# Patient Record
Sex: Male | Born: 1980 | Race: White | Hispanic: No | Marital: Married | Smoking: Never smoker
Health system: Southern US, Community
[De-identification: ages and names within clinical notes are randomized; demographics above are authoritative.]

## PROBLEM LIST (undated history)

## (undated) DIAGNOSIS — T7840XA Allergy, unspecified, initial encounter: Secondary | ICD-10-CM

## (undated) DIAGNOSIS — I10 Essential (primary) hypertension: Secondary | ICD-10-CM

## (undated) HISTORY — DX: Essential (primary) hypertension: I10

## (undated) HISTORY — DX: Allergy, unspecified, initial encounter: T78.40XA

---

## 2014-05-31 ENCOUNTER — Ambulatory Visit (INDEPENDENT_AMBULATORY_CARE_PROVIDER_SITE_OTHER): Payer: BC Managed Care – HMO

## 2014-05-31 ENCOUNTER — Ambulatory Visit (INDEPENDENT_AMBULATORY_CARE_PROVIDER_SITE_OTHER): Payer: BC Managed Care – HMO | Admitting: Family Medicine

## 2014-05-31 VITALS — BP 132/80 | HR 121 | Temp 101.0°F | Resp 17 | Ht 69.0 in | Wt 321.0 lb

## 2014-05-31 DIAGNOSIS — R509 Fever, unspecified: Secondary | ICD-10-CM

## 2014-05-31 DIAGNOSIS — R059 Cough, unspecified: Secondary | ICD-10-CM

## 2014-05-31 DIAGNOSIS — R05 Cough: Secondary | ICD-10-CM

## 2014-05-31 DIAGNOSIS — J189 Pneumonia, unspecified organism: Secondary | ICD-10-CM

## 2014-05-31 LAB — POCT CBC
Granulocyte percent: 80.9 %G — AB (ref 37–80)
HCT, POC: 43.5 % (ref 43.5–53.7)
Hemoglobin: 13.8 g/dL — AB (ref 14.1–18.1)
Lymph, poc: 2.3 (ref 0.6–3.4)
MCH, POC: 26.9 pg — AB (ref 27–31.2)
MCHC: 31.7 g/dL — AB (ref 31.8–35.4)
MCV: 84.7 fL (ref 80–97)
MID (cbc): 0.3 (ref 0–0.9)
MPV: 7 fL (ref 0–99.8)
POC Granulocyte: 10.9 — AB (ref 2–6.9)
POC LYMPH PERCENT: 17.1 %L (ref 10–50)
POC MID %: 2 %M (ref 0–12)
Platelet Count, POC: 266 10*3/uL (ref 142–424)
RBC: 5.14 M/uL (ref 4.69–6.13)
RDW, POC: 14.8 %
WBC: 13.5 10*3/uL — AB (ref 4.6–10.2)

## 2014-05-31 MED ORDER — CEFTRIAXONE SODIUM 1 G IJ SOLR
1.0000 g | Freq: Once | INTRAMUSCULAR | Status: AC
  Administered 2014-05-31: 1 g via INTRAMUSCULAR

## 2014-05-31 MED ORDER — HYDROCODONE-HOMATROPINE 5-1.5 MG/5ML PO SYRP: 2.5000 mL | ORAL_SOLUTION | Freq: Three times a day (TID) | ORAL | Status: DC | PRN

## 2014-05-31 MED ORDER — LEVOFLOXACIN 500 MG PO TABS: 500.0000 mg | ORAL_TABLET | Freq: Every day | ORAL | Status: DC

## 2014-05-31 NOTE — Progress Notes (Signed)
   Subjective:    Patient ID: Samuel Moss, male    DOB: [birth year], 33 y.o.   MRN: [id number]  HPI 33 year old male presents for evaluation of 5 day history of productive cough, fatigue, scratchy, irritated throat, and slight nasal congestion. Admits he developed chills and subjective fever 2 days ago, but did not have a thermometer to check his temperature.  He has been taking tylenol and aleve as needed for fever.  He is having chest pain secondary to coughing and slight subjective wheezing at night. No chest tightness or SOB.   Works as a Education officer, environmental.   Otherwise doing well with no other concerns today.    Review of Systems  Constitutional: Positive for fever (subjective) and chills.  HENT: Positive for congestion, postnasal drip and sore throat. Negative for ear pain.   Respiratory: Positive for cough and wheezing. Negative for chest tightness and shortness of breath.   Gastrointestinal: Negative for nausea, vomiting and abdominal pain.  Neurological: Negative for dizziness and headaches.       Objective:   Physical Exam  Constitutional: He is oriented to person, place, and time. He appears well-developed and well-nourished.  HENT:  Head: Normocephalic and atraumatic.  Right Ear: Hearing, tympanic membrane, external ear and ear canal normal.  Left Ear: Hearing, tympanic membrane, external ear and ear canal normal.  Mouth/Throat: Uvula is midline, oropharynx is clear and moist and mucous membranes are normal.  Eyes: Conjunctivae are normal.  Neck: Normal range of motion. Neck supple.  Cardiovascular: Normal heart sounds.  Tachycardia present.   Pulmonary/Chest: Effort normal. He has decreased breath sounds. He has no wheezes. He has no rhonchi. He has rales in the right upper field, the right middle field and the right lower field.  Lymphadenopathy:    He has no cervical adenopathy.  Neurological: He is alert and oriented to person, place, and time.  Psychiatric: He has a normal mood  and affect. His behavior is normal. Judgment and thought content normal.     [facility] reading (PRIMARY) by  Dr. Patsy Lager patchy infiltrates through right lung.  Tylenol 1000 mg po given today while in office. Reduced fever to 100.0 and pulse to 112 SpO2 95% during ambulation.     Assessment & Plan:   CAP (community acquired pneumonia) - Plan: cefTRIAXone (ROCEPHIN) injection 1 g, levofloxacin (LEVAQUIN) 500 MG tablet  Cough - Plan: POCT CBC, DG Chest 2 View, cefTRIAXone (ROCEPHIN) injection 1 g  Fever, unspecified - Plan: POCT CBC, DG Chest 2 View  Rocephin 1 gram given today in the office.  Start Levaquin 500 mg po daily x 10 days.  Continue tylenol or ibuprofen as directed for fever.  May take hycodan 1/5 tsp qhs for cough Recheck in 24 hours with Dr. Patsy Lager - sooner if worse. Instructed to go to the ER if acutely worse through the night.

## 2014-06-01 ENCOUNTER — Ambulatory Visit (INDEPENDENT_AMBULATORY_CARE_PROVIDER_SITE_OTHER): Payer: BC Managed Care – HMO | Admitting: Family Medicine

## 2014-06-01 VITALS — BP 130/100 | HR 107 | Temp 98.9°F | Resp 18 | Ht 68.25 in | Wt 318.8 lb

## 2014-06-01 DIAGNOSIS — J189 Pneumonia, unspecified organism: Secondary | ICD-10-CM

## 2014-06-01 MED ORDER — CEFTRIAXONE SODIUM 1 G IJ SOLR
1.0000 g | Freq: Once | INTRAMUSCULAR | Status: AC
  Administered 2014-06-01: 1 g via INTRAMUSCULAR

## 2014-06-01 NOTE — Progress Notes (Signed)
[facility] [address], [place] [address] [phone]  Date:  06/01/2014   Name:  Samuel Moss   DOB:  04/17/1981   MRN:  [id number]  PCP:  No PCP Per Patient    Chief Complaint: Follow-up   History of Present Illness:  Samuel Moss is a 33 y.o. very pleasant male patient who presents with the following:  Here today to recheck pneumonia- see OV from yesterday.  Yesterday he was found to have a right sided pneumonia, tx with rocephin and po levaquin.  Last took naproxen this morning around 0700.   He is feeling better, fever seems to be under control and he is breathing more easily. He slept better last night.  Overall he still does not feel great but is improving  There are no active problems to display for this patient.   Past Medical History  Diagnosis Date  . Allergy   . Hypertension     History reviewed. No pertinent past surgical history.  History  Substance Use Topics  . Smoking status: Never Smoker   . Smokeless tobacco: Not on file  . Alcohol Use: No    Family History  Problem Relation Age of Onset  . Hypertension Mother   . Hypertension Father   . Mental illness Maternal Grandfather     No Known Allergies  Medication list has been reviewed and updated.  Current Outpatient Prescriptions on File Prior to Visit  Medication Sig Dispense Refill  . levofloxacin (LEVAQUIN) 500 MG tablet Take 1 tablet (500 mg total) by mouth daily.  10 tablet  0  . HYDROcodone-homatropine (HYCODAN) 5-1.5 MG/5ML syrup Take 2.5-5 mLs by mouth every 8 (eight) hours as needed for cough.  120 mL  0   No current facility-administered medications on file prior to visit.    Review of Systems:  As per HPI- otherwise negative.   Physical Examination: Filed Vitals:   06/01/14 1241  BP: 130/100  Pulse: 107  Temp: 98.9 F (37.2 C)  Resp: 18   Filed Vitals:   06/01/14 1241  Height: 5' 8.25" (1.734 m)  Weight: 318 lb 12.8 oz (144.607 kg)   Body  mass index is 48.09 kg/(m^2). Ideal Body Weight: Weight in (lb) to have BMI = 25: 165.3  GEN: WDWN, NAD, Non-toxic, A & O x 3, obese, looks well HEENT: Atraumatic, Normocephalic. Neck supple. No masses, No LAD. Ears and Nose: No external deformity. CV: RRR, No M/G/R. No JVD. No thrill. No extra heart sounds.  I counted his pulse at 90 BPM PULM: CTA B, no wheezes or rhonchi. No retractions. No resp. distress. No accessory muscle use.  He does have some right sided crackles but these are improved  EXTR: No c/c/e NEURO Normal gait.  PSYCH: Normally interactive. Conversant. Not depressed or anxious appearing.  Calm demeanor.    Assessment and Plan: CAP (community acquired pneumonia) - Plan: cefTRIAXone (ROCEPHIN) injection 1 g  Improved.  Treated with another gram of rocephin today.  As long as he continues to improve he does not have to come back in tomorrow.  However did ask him to come in for a recheck and repeat CXR in about 3 weeks.  He will follow-up right away if he starts getting worse again  Signed Abbe Amsterdam, MD

## 2014-06-01 NOTE — Patient Instructions (Signed)
We are glad that you are feeling better!  Assuming you continue to improve please come and see [place] in about 3 weeks for a follow-up visit and repeat chest x-ray. However if you get worse or have any other concerns call or come back sooner.

## 2014-07-07 ENCOUNTER — Ambulatory Visit (INDEPENDENT_AMBULATORY_CARE_PROVIDER_SITE_OTHER): Payer: BC Managed Care – HMO

## 2014-07-07 ENCOUNTER — Ambulatory Visit (INDEPENDENT_AMBULATORY_CARE_PROVIDER_SITE_OTHER): Payer: BC Managed Care – HMO | Admitting: Family Medicine

## 2014-07-07 VITALS — BP 158/78 | HR 94 | Temp 99.6°F | Resp 18 | Wt 318.0 lb

## 2014-07-07 DIAGNOSIS — R490 Dysphonia: Secondary | ICD-10-CM

## 2014-07-07 DIAGNOSIS — R030 Elevated blood-pressure reading, without diagnosis of hypertension: Secondary | ICD-10-CM

## 2014-07-07 DIAGNOSIS — J45909 Unspecified asthma, uncomplicated: Secondary | ICD-10-CM

## 2014-07-07 DIAGNOSIS — J309 Allergic rhinitis, unspecified: Secondary | ICD-10-CM

## 2014-07-07 DIAGNOSIS — J189 Pneumonia, unspecified organism: Secondary | ICD-10-CM

## 2014-07-07 DIAGNOSIS — R0982 Postnasal drip: Secondary | ICD-10-CM

## 2014-07-07 MED ORDER — CETIRIZINE HCL 10 MG PO TABS: 10.0000 mg | ORAL_TABLET | Freq: Every day | ORAL | Status: AC

## 2014-07-07 MED ORDER — BECLOMETHASONE DIPROPIONATE 80 MCG/ACT NA AERS: 2.0000 | INHALATION_SPRAY | Freq: Every day | NASAL | Status: AC

## 2014-07-07 NOTE — Progress Notes (Signed)
Subjective:  This chart was scribed for Norberto SorensonEva Ferrel Simington, MD, by Yevette EdwardsAngela Bracken, ED Scribe. This  patient's care was started at 4:03 PM.    Patient ID: Samuel Moss Topper, male    DOB: 04-10-1981, 33 y.o.   MRN: [id number]  Chief Complaint  Patient presents with  . Follow-up    PNA    HPI  Samuel Moss Aguallo is a 33 y.o. male [facility].  Per medical records: The pt was diagnosed with pneumonia by Dr. Patsy Lageropland five weeks ago. He was treated with Rocephin and Levqquin; he was told to return to clinic for a repeat chest x-ray.   The pt reports improvement to the symptoms. He endorses a continual mild cough since the diagnosis. He denies wheezing, SOB, chest pain, fever over 100 F, or chills.   He states yesterday he experienced a brief episode of dizziness which resolved spontaneously.   The pt also endorses hoarseness. He reports a h/o hoarseness attributed to allergies. He denies heart burn or indigestion or a metallic taste. He endorses nasal congestion and phlegm in the morning. He has used Claritin; he denies regular use of saline spray.   He denies smoking. He denies a h/o asthma.  His wife denies snoring or apnea in the pt. The pt reports he normally feels well-rested in the morning. He denies frequent naps.  The pt's wife reports he normally has an elevated diastolic of 89-90; in the ED his BP is 158/78. He does not assess his BP regularly.   The pt and his wife have three children. He reports his daughter was recently diagnosed with Type I Diabetes; her blood sugar was over 1100 when she was assessed; she has experienced neuropathy to her feet and legs. Their endocrinologist is Dr. Vanessa DurhamBadik.   Mr. Hyacinth MeekerMiller recently started a new job; he is a Education officer, environmentalpastor.   Past Medical History  Diagnosis Date  . Allergy   . Hypertension    Current Outpatient Prescriptions on File Prior to Visit  Medication Sig Dispense Refill  . HYDROcodone-homatropine (HYCODAN) 5-1.5 MG/5ML syrup Take 2.5-5  mLs by mouth every 8 (eight) hours as needed for cough.  120 mL  0  . levofloxacin (LEVAQUIN) 500 MG tablet Take 1 tablet (500 mg total) by mouth daily.  10 tablet  0   No current facility-administered medications on file prior to visit.   No Known Allergies   Review of Systems  Constitutional: Negative for fever and chills.  HENT: Positive for congestion, postnasal drip and voice change.   Respiratory: Positive for cough. Negative for shortness of breath and wheezing.   Cardiovascular: Negative for chest pain.  Neurological: Positive for dizziness.   Triage Vitals: BP 158/78  Pulse 94  Temp(Src) 99.6 F (37.6 C) (Oral)  Resp 18  Wt 318 lb (144.244 kg)  SpO2 98%     Objective:   Physical Exam  Nursing note and vitals reviewed. Constitutional: He is oriented to person, place, and time. He appears well-developed and well-nourished. No distress.  HENT:  Head: Normocephalic and atraumatic.  Right Ear: Tympanic membrane is not injected and not erythematous. A middle ear effusion is present.  Left Ear: Tympanic membrane is not injected and not erythematous. A middle ear effusion is present.  Nose: Nose normal.  Mouth/Throat: No oropharyngeal exudate, posterior oropharyngeal edema or posterior oropharyngeal erythema.  Tonsils large, 2+ at baseline, with small oropharynx.  Eyes: Conjunctivae and EOM are normal.  Neck: Neck supple. No tracheal  deviation present. No thyromegaly present.  Cardiovascular: Normal rate, regular rhythm, S1 normal and S2 normal.   No murmur heard. Pulmonary/Chest: Effort normal and breath sounds normal. No respiratory distress. He has no wheezes. He has no rales.  Musculoskeletal: Normal range of motion.  Lymphadenopathy:       Head (right side): Tonsillar adenopathy present. No submental and no submandibular adenopathy present.       Head (left side): Tonsillar adenopathy present. No submental adenopathy present.    He has cervical adenopathy.        Right: No supraclavicular adenopathy present.       Left: No supraclavicular adenopathy present.  Neurological: He is alert and oriented to person, place, and time.  Skin: Skin is warm and dry.  Psychiatric: He has a normal mood and affect. His behavior is normal.   [facility] reading (PRIMARY) by  Dr. Norberto SorensonEva Haniel Fix. Chest x-ray. No acute abnormality. Lung fields clear. Prior infiltrate resolved.       Assessment & Plan:  Pneumonia, unspecified laterality, unspecified part of lung - Plan: DG Chest 2 View - RESOLVED  Allergic rhinitis with postnasal drip - Advise pt to use allergy medication daily.  Hoarseness of voice  Elevated blood pressure (not hypertension) - Encouraged pt to monitor BP outside of office.   Meds ordered this encounter  Medications  . cetirizine (ZYRTEC) 10 MG tablet    Sig: Take 1 tablet (10 mg total) by mouth at bedtime.    Dispense:  90 tablet    Refill:  3  . Beclomethasone Dipropionate (QNASL) 80 MCG/ACT AERS    Sig: Place 2 sprays into the nose at bedtime.    Dispense:  1 Inhaler    Refill:  11    I personally performed the services described in this documentation, which was scribed in my presence. The recorded information has been reviewed and considered, and addended by me as needed.  Norberto SorensonEva Danuel Felicetti, MD MPH

## 2014-07-07 NOTE — Patient Instructions (Signed)
How to Take Your Blood Pressure HOW DO I GET A BLOOD PRESSURE MACHINE?  You can buy an electronic home blood pressure machine at your local pharmacy. Insurance will sometimes cover the cost if you have a prescription.  Ask your doctor what type of machine is best for you. There are different machines for your arm and your wrist.  If you decide to buy a machine to check your blood pressure on your arm, first check the size of your arm so you can buy the right size cuff. To check the size of your arm:   Use a measuring tape that shows both inches and centimeters.   Wrap the measuring tape around the upper-middle part of your arm. You may need someone to help you measure.   Write down your arm measurement in both inches and centimeters.   To measure your blood pressure correctly, it is important to have the right size cuff.   If your arm is up to 13 inches (up to 34 centimeters), get an adult cuff size.  If your arm is 13 to 17 inches (35 to 44 centimeters), get a large adult cuff size.    If your arm is 17 to 20 inches (45 to 52 centimeters), get an adult thigh cuff.  WHAT DO THE NUMBERS MEAN?   There are two numbers that make up your blood pressure. For example: 120/80.  The first number (120 in our example) is called the "systolic pressure." It is a measure of the pressure in your blood vessels when your heart is pumping blood.  The second number (80 in our example) is called the "diastolic pressure." It is a measure of the pressure in your blood vessels when your heart is resting between beats.  Your doctor will tell you what your blood pressure should be. WHAT SHOULD I DO BEFORE I CHECK MY BLOOD PRESSURE?   Try to rest or relax for at least 30 minutes before you check your blood pressure.  Do not smoke.  Do not have any drinks with caffeine, such as:  Soda.  Coffee.  Tea.  Check your blood pressure in a quiet room.  Sit down and stretch out your arm on a table.  Keep your arm at about the level of your heart. Let your arm relax.  Make sure that your legs are not crossed. HOW DO I CHECK MY BLOOD PRESSURE?  Follow the directions that came with your machine.  Make sure you remove any tight-fighting clothing from your arm or wrist. Wrap the cuff around your upper arm or wrist. You should be able to fit a finger between the cuff and your arm. If you cannot fit a finger between the cuff and your arm, it is too tight and should be removed and rewrapped.  Some units require you to manually pump up the arm cuff.  Automatic units inflate the cuff when you press a button.  Cuff deflation is automatic in both models.  After the cuff is inflated, the unit measures your blood pressure and pulse. The readings are shown on a monitor. Hold still and breathe normally while the cuff is inflated.  Getting a reading takes less than a minute.  Some models store readings in a memory. Some provide a printout of readings. If your machine does not store your readings, keep a written record.  Take readings with you to your next visit with your doctor. Document Released: 08/28/2008 Document Revised: 01/30/2014 Document Reviewed: 11/10/2013 [employer] Patient Information   2015 [employer]. This information is not intended to replace advice given to you by your health care provider. Make sure you discuss any questions you have with your health care provider. Managing Your High Blood Pressure Blood pressure is a measurement of how forceful your blood is pressing against the walls of the arteries. Arteries are muscular tubes within the circulatory system. Blood pressure does not stay the same. Blood pressure rises when you are active, excited, or nervous; and it lowers during sleep and relaxation. If the numbers measuring your blood pressure stay above normal most of the time, you are at risk for health problems. High blood pressure (hypertension) is a long-term (chronic)  condition in which blood pressure is elevated. A blood pressure reading is recorded as two numbers, such as 120 over 80 (or 120/80). The first, higher number is called the systolic pressure. It is a measure of the pressure in your arteries as the heart beats. The second, lower number is called the diastolic pressure. It is a measure of the pressure in your arteries as the heart relaxes between beats.  Keeping your blood pressure in a normal range is important to your overall health and prevention of health problems, such as heart disease and stroke. When your blood pressure is uncontrolled, your heart has to work harder than normal. High blood pressure is a very common condition in adults because blood pressure tends to rise with age. Men and women are equally likely to have hypertension but at different times in life. Before age 845, men are more likely to have hypertension. After 33 years of age, women are more likely to have it. Hypertension is especially common in African Americans. This condition often has no signs or symptoms. The cause of the condition is usually not known. Your caregiver can help you come up with a plan to keep your blood pressure in a normal, healthy range. BLOOD PRESSURE STAGES Blood pressure is classified into four stages: normal, prehypertension, stage 1, and stage 2. Your blood pressure reading will be used to determine what type of treatment, if any, is necessary. Appropriate treatment options are tied to these four stages:  Normal  Systolic pressure (mm Hg): below 120.  Diastolic pressure (mm Hg): below 80. Prehypertension  Systolic pressure (mm Hg): 120 to 139.  Diastolic pressure (mm Hg): 80 to 89. Stage1  Systolic pressure (mm Hg): 140 to 159.  Diastolic pressure (mm Hg): 90 to 99. Stage2  Systolic pressure (mm Hg): 160 or above.  Diastolic pressure (mm Hg): 100 or above. RISKS RELATED TO HIGH BLOOD PRESSURE Managing your blood pressure is an important  responsibility. Uncontrolled high blood pressure can lead to:  A heart attack.  A stroke.  A weakened blood vessel (aneurysm).  Heart failure.  Kidney damage.  Eye damage.  Metabolic syndrome.  Memory and concentration problems. HOW TO MANAGE YOUR BLOOD PRESSURE Blood pressure can be managed effectively with lifestyle changes and medicines (if needed). Your caregiver will help you come up with a plan to bring your blood pressure within a normal range. Your plan should include the following: Education  Read all information provided by your caregivers about how to control blood pressure.  Educate yourself on the latest guidelines and treatment recommendations. New research is always being done to further define the risks and treatments for high blood pressure. Lifestylechanges  Control your weight.  Avoid smoking.  Stay physically active.  Reduce the amount of salt in your diet.  Reduce stress.  Control any chronic conditions, such as high cholesterol or diabetes.  Reduce your alcohol intake. Medicines  Several medicines (antihypertensive medicines) are available, if needed, to bring blood pressure within a normal range. Communication  Review all the medicines you take with your caregiver because there may be side effects or interactions.  Talk with your caregiver about your diet, exercise habits, and other lifestyle factors that may be contributing to high blood pressure.  See your caregiver regularly. Your caregiver can help you create and adjust your plan for managing high blood pressure. RECOMMENDATIONS FOR TREATMENT AND FOLLOW-UP  The following recommendations are based on current guidelines for managing high blood pressure in nonpregnant adults. Use these recommendations to identify the proper follow-up period or treatment option based on your blood pressure reading. You can discuss these options with your caregiver.  Systolic pressure of 120 to 139 or  diastolic pressure of 80 to 89: Follow up with your caregiver as directed.  Systolic pressure of 140 to 160 or diastolic pressure of 90 to 100: Follow up with your caregiver within 2 months.  Systolic pressure above 160 or diastolic pressure above 100: Follow up with your caregiver within 1 month.  Systolic pressure above 180 or diastolic pressure above 110: Consider antihypertensive therapy; follow up with your caregiver within 1 week.  Systolic pressure above 200 or diastolic pressure above 120: Begin antihypertensive therapy; follow up with your caregiver within 1 week. Document Released: 06/09/2012 Document Reviewed: 06/09/2012 [facility] Patient Information 2015 [place], [place]. This information is not intended to replace advice given to you by your health care provider. Make sure you discuss any questions you have with your health care provider.   Allergic Rhinitis Allergic rhinitis is when the mucous membranes in the nose respond to allergens. Allergens are particles in the air that cause your body to have an allergic reaction. This causes you to release allergic antibodies. Through a chain of events, these eventually cause you to release histamine into the blood stream. Although meant to protect the body, it is this release of histamine that causes your discomfort, such as frequent sneezing, congestion, and an itchy, runny nose.  CAUSES  Seasonal allergic rhinitis (hay fever) is caused by pollen allergens that may come from grasses, trees, and weeds. Year-round allergic rhinitis (perennial allergic rhinitis) is caused by allergens such as house dust mites, pet dander, and mold spores.  SYMPTOMS   Nasal stuffiness (congestion).  Itchy, runny nose with sneezing and tearing of the eyes. DIAGNOSIS  Your health care provider can help you determine the allergen or allergens that trigger your symptoms. If you and your health care provider are unable to determine the allergen, skin or blood  testing may be used. TREATMENT  Allergic rhinitis does not have a cure, but it can be controlled by:  Medicines and allergy shots (immunotherapy).  Avoiding the allergen. Hay fever may often be treated with antihistamines in pill or nasal spray forms. Antihistamines block the effects of histamine. There are over-the-counter medicines that may help with nasal congestion and swelling around the eyes. Check with your health care provider before taking or giving this medicine.  If avoiding the allergen or the medicine prescribed do not work, there are many new medicines your health care provider can prescribe. Stronger medicine may be used if initial measures are ineffective. Desensitizing injections can be used if medicine and avoidance does not work. Desensitization is when a patient is given ongoing shots until the body becomes less sensitive to the  allergen. Make sure you follow up with your health care provider if problems continue. HOME CARE INSTRUCTIONS It is not possible to completely avoid allergens, but you can reduce your symptoms by taking steps to limit your exposure to them. It helps to know exactly what you are allergic to so that you can avoid your specific triggers. SEEK MEDICAL CARE IF:   You have a fever.  You develop a cough that does not stop easily (persistent).  You have shortness of breath.  You start wheezing.  Symptoms interfere with normal daily activities. Document Released: 06/10/2001 Document Revised: 09/20/2013 Document Reviewed: 05/23/2013 [facility] Patient Information 2015 [place], [place]. This information is not intended to replace advice given to you by your health care provider. Make sure you discuss any questions you have with your health care provider. Hoarseness Hoarseness is produced from a variety of causes. It is important to find the cause so it can be treated. In the absence of a cold or upper respiratory illness, any hoarseness lasting more than 2  weeks should be looked at by a specialist. This is especially important if you have a history of smoking or alcohol use. It is also important to keep in mind that as you grow older, your voice will naturally get weaker, making it easier for you to become hoarse from straining your vocal cords.  CAUSES  Any illness that affects your vocal cords can result in a hoarse voice. Examples of conditions that can affect the vocal cords are listed as follows:   Allergies.  Colds.  Sinusitis.  Gastroesophageal reflux disease.  Croup.  Injury.  Nodules.  Exposure to smoke or toxic fumes or gases.  Congenital and genetic defects.  Paralysis of the vocal cords.  Infections.  Advanced age. DIAGNOSIS  In order to diagnose the cause of your hoarseness, your caregiver will examine your throat using an instrument that uses a tube with a small lighted camera (laryngoscope). It allows your caregiver to look into the mouth and down the throat. TREATMENT  For most cases, treatment will focus on the specific cause of the hoarseness. Depending on the cause, hoarseness can be a temporary condition (acute) or it can be long lasting (chronic). Most cases of hoarseness clear up without complications. Your caregiver will explain to you if this is not likely to happen. SEEK IMMEDIATE MEDICAL CARE IF:   You have increasing hoarseness or loss of voice.  You have shortness of breath.  You are coughing up blood.  There is pain in your neck or throat. Document Released: 08/29/2005 Document Revised: 12/08/2011 Document Reviewed: 11/21/2010 [facility] Patient Information 2015 [place], [place]. This information is not intended to replace advice given to you by your health care provider. Make sure you discuss any questions you have with your health care provider.

## 2014-07-18 ENCOUNTER — Ambulatory Visit (INDEPENDENT_AMBULATORY_CARE_PROVIDER_SITE_OTHER): Payer: BC Managed Care – HMO | Admitting: Physician Assistant

## 2014-07-18 VITALS — BP 168/98 | HR 112 | Temp 98.3°F | Resp 18 | Ht 69.0 in | Wt 317.8 lb

## 2014-07-18 DIAGNOSIS — IMO0001 Reserved for inherently not codable concepts without codable children: Secondary | ICD-10-CM

## 2014-07-18 DIAGNOSIS — J029 Acute pharyngitis, unspecified: Secondary | ICD-10-CM

## 2014-07-18 DIAGNOSIS — R030 Elevated blood-pressure reading, without diagnosis of hypertension: Secondary | ICD-10-CM

## 2014-07-18 DIAGNOSIS — J020 Streptococcal pharyngitis: Secondary | ICD-10-CM

## 2014-07-18 LAB — POCT RAPID STREP A (OFFICE): Rapid Strep A Screen: POSITIVE — AB

## 2014-07-18 MED ORDER — PENICILLIN G BENZATHINE 1200000 UNIT/2ML IM SUSP
1.2000 10*6.[IU] | Freq: Once | INTRAMUSCULAR | Status: AC
  Administered 2014-07-18: 1.2 10*6.[IU] via INTRAMUSCULAR

## 2014-07-18 NOTE — Progress Notes (Signed)
I was directly involved with the patient's care and agree with the physical, diagnosis and treatment plan.  

## 2014-07-18 NOTE — Progress Notes (Signed)
Subjective:    Patient ID: Samuel Moss, male    DOB: [birth year], 33 y.o.   MRN: [id number]  HPI Patient presents with sore throat for 3 days that has gotten progressively worse and is accompanied by productive cough and minimal rhinorrhea. Denies fever, smoking, or hx of asthma. Has seasonal allergies that maintained with daily cetirizine and Qnas. Patient is a Optician, dispensingminister with many sick contacts and was in the ER with his wife recently.    Review of Systems  Constitutional: Positive for fatigue. Negative for fever, chills, activity change and appetite change.  HENT: Positive for congestion, postnasal drip, rhinorrhea and sore throat. Negative for ear discharge, ear pain, facial swelling, sinus pressure and sneezing.   Eyes: Negative for pain, discharge and itching.  Respiratory: Positive for cough (productive). Negative for chest tightness, shortness of breath and wheezing.   Cardiovascular: Negative for chest pain, palpitations and leg swelling.  Gastrointestinal: Negative for nausea, vomiting, abdominal pain, diarrhea and constipation.  Musculoskeletal: Positive for neck pain (near lymph nodes). Negative for myalgias and neck stiffness.  Allergic/Immunologic: Positive for environmental allergies. Negative for food allergies.  Neurological: Negative for dizziness, light-headedness and headaches.  Hematological: Positive for adenopathy.       Objective:   Physical Exam  Constitutional: He is oriented to person, place, and time. He appears well-developed and well-nourished. No distress.  Blood pressure 168/98, pulse 112, temperature 98.3 F (36.8 C), temperature source Oral, resp. rate 18, height 5\' 9"  (1.753 m), weight 317 lb 12.8 oz (144.153 kg), SpO2 98.00%.   HENT:  Head: Normocephalic and atraumatic.  Right Ear: Tympanic membrane, external ear and ear canal normal.  Left Ear: Tympanic membrane, external ear and ear canal normal.  Nose: Rhinorrhea present. No mucosal edema. Right  sinus exhibits no maxillary sinus tenderness and no frontal sinus tenderness. Left sinus exhibits no maxillary sinus tenderness and no frontal sinus tenderness.  Mouth/Throat: Uvula is midline. Posterior oropharyngeal edema (4+ hypertrophic tonsils) and posterior oropharyngeal erythema present. No oropharyngeal exudate.  Eyes: Conjunctivae and EOM are normal. Pupils are equal, round, and reactive to light. Right eye exhibits no discharge. Left eye exhibits no discharge. No scleral icterus.  Neck: Normal range of motion. Neck supple. No Brudzinski's sign noted.  Cardiovascular: Normal rate, regular rhythm and normal heart sounds.  Exam reveals no gallop.   No murmur heard. Pulmonary/Chest: Effort normal. No respiratory distress. He has no wheezes. He has no rales. He exhibits no tenderness.  Abdominal: Soft. Bowel sounds are normal. There is tenderness.  Musculoskeletal: He exhibits no edema and no tenderness.  Lymphadenopathy:    He has cervical adenopathy.  Neurological: He is alert and oriented to person, place, and time.  Skin: Skin is warm and dry. No rash noted. He is not diaphoretic. No erythema. No pallor.    Results for orders placed in visit on 07/18/14  POCT RAPID STREP A (OFFICE)      Result Value Ref Range   Rapid Strep A Screen Positive (*) Negative       Assessment & Plan:  1. Strep pharyngitis 2. Sore throat - POCT rapid strep A - penicillin g benzathine (BICILLIN LA) 1200000 UNIT/2ML injection 1.2 Million Units; Inject 2 mLs (1.2 Million Units total) into the muscle once.  3. Elevated blood pressure Discussed blood pressure with patient and the importance of getting it evaluated when he is well.   Janan Ridgeishira Licia Harl PA-C  [facility] 07/18/2014 10:00 AM

## 2014-07-20 ENCOUNTER — Telehealth: Payer: Self-pay | Admitting: Physician Assistant

## 2014-07-20 NOTE — Telephone Encounter (Signed)
Please call the patient.  We saw him on Tues for a sore throat.  Please make sure he is getting better.  We just information about his wife having strep throat and if he is still having problems I would like to treat him for strep throat.  If all his throat pain is gone I think it is ok that we do not treat him.

## 2014-07-21 NOTE — Telephone Encounter (Signed)
Spoke with pt. He is feeling much better.  

## 2014-07-21 NOTE — Telephone Encounter (Signed)
Great!

## 2014-07-21 NOTE — Telephone Encounter (Signed)
Left message on machine to call back  

## 2015-09-06 IMAGING — CR DG CHEST 2V
2 series · 2 of 2 positions shown · non-contrast
Comparison: None.

CLINICAL DATA: Cough

EXAM:
CHEST  2 VIEW

[PA]
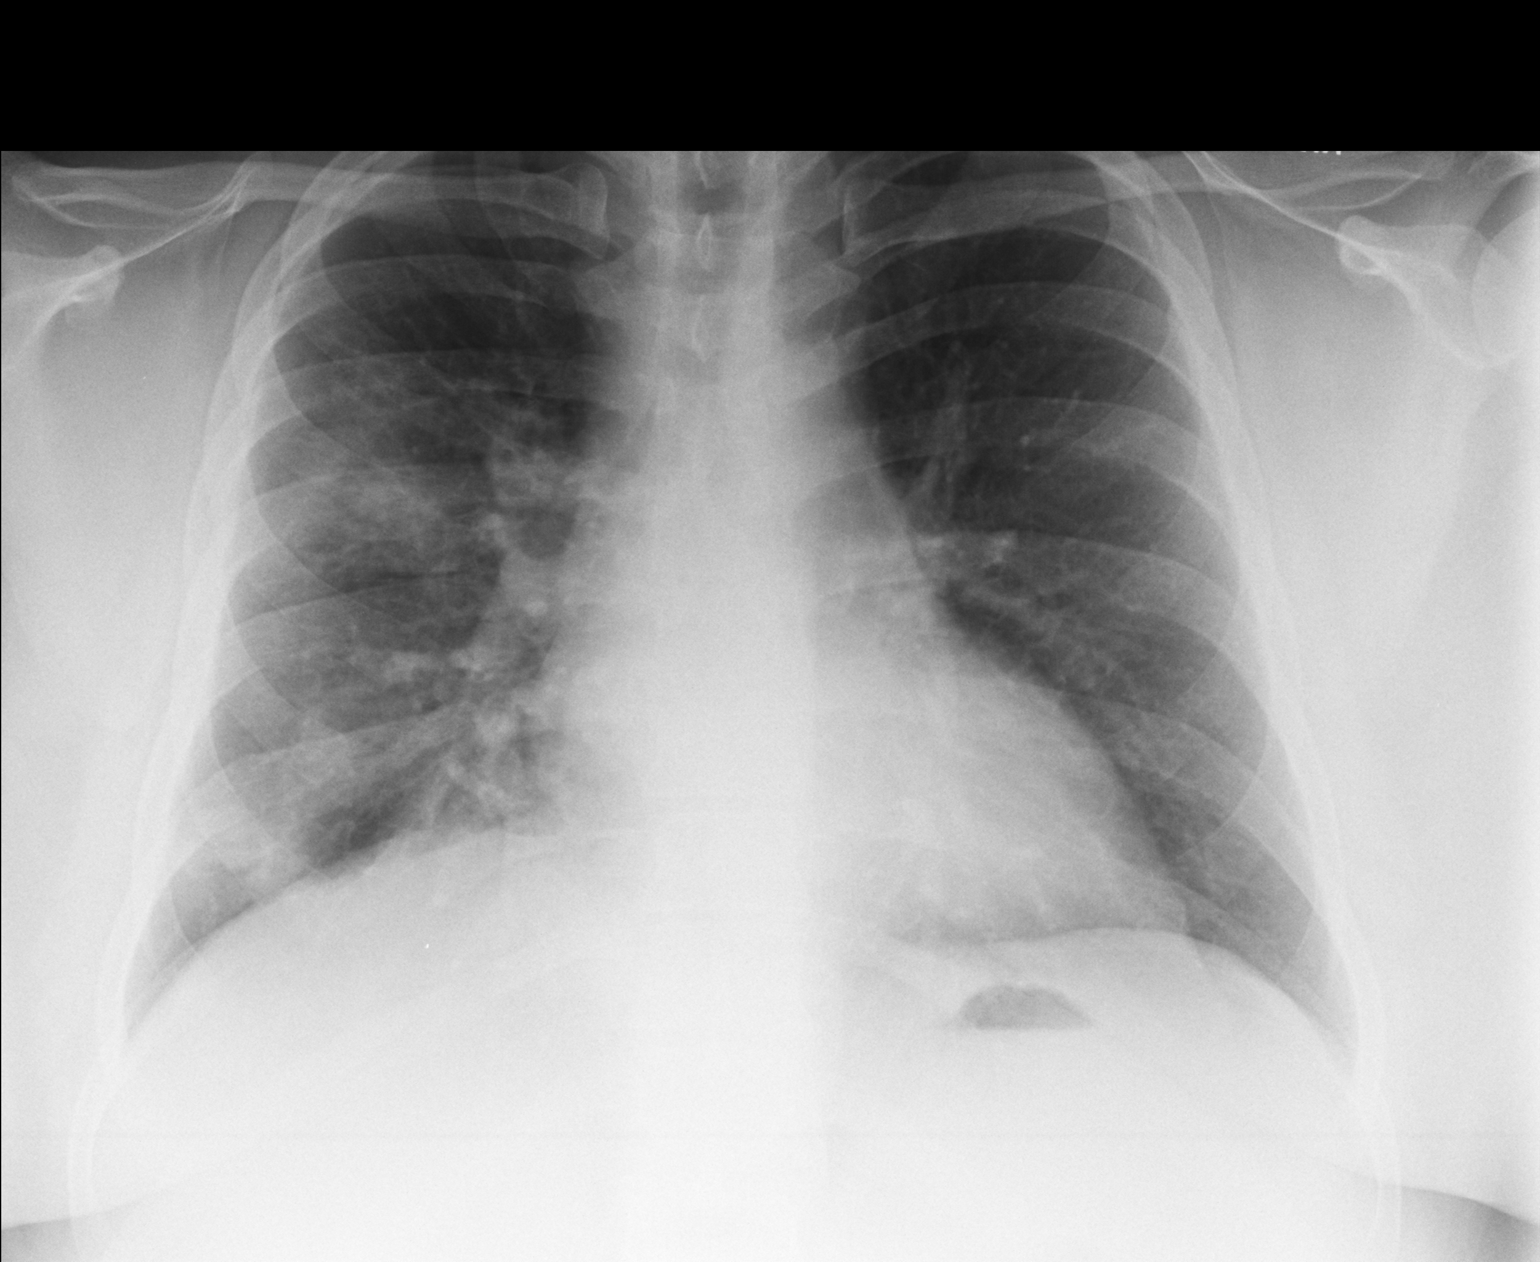

[lateral]
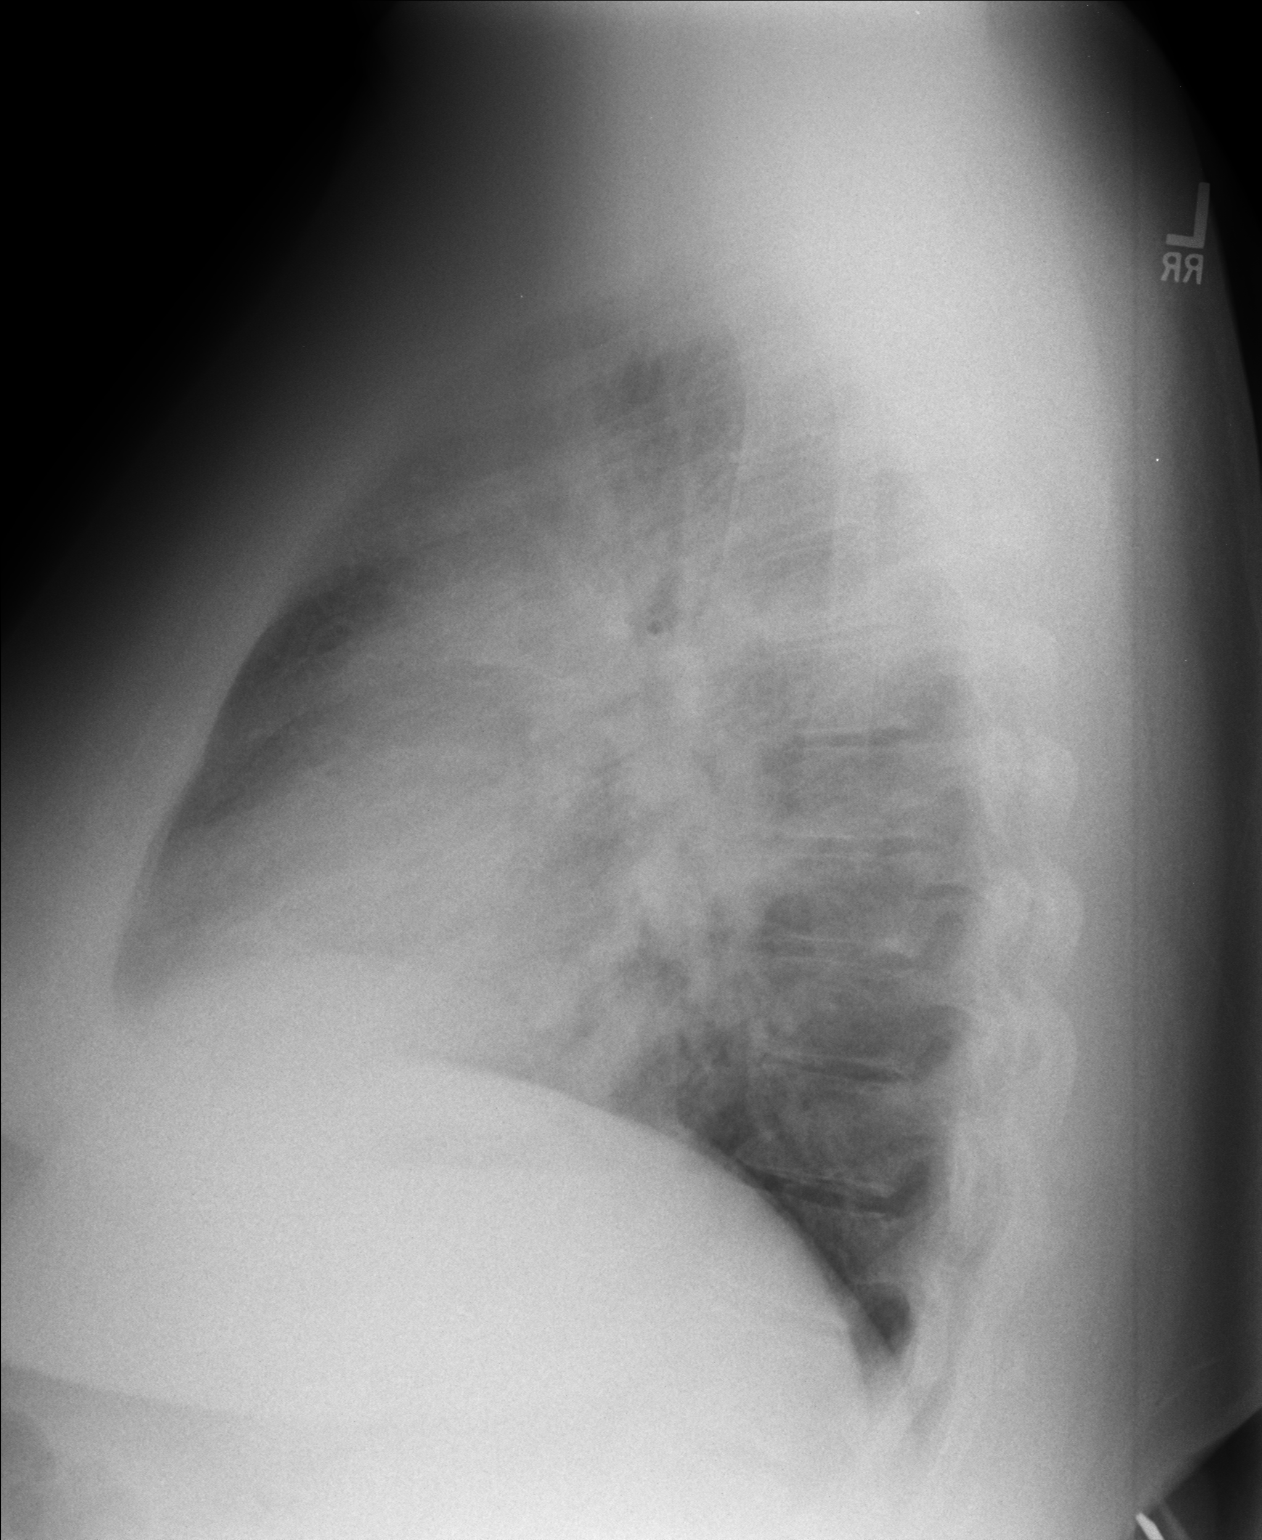

[2 of 2 positions shown; findings below may reference images not displayed]

FINDINGS: The heart size and mediastinal contours appear normal. There is no
pleural effusion. Multifocal patchy areas of consolidation within
the right lung are identified and are concerning for pneumonia. The
visualized osseous structures are unremarkable.
IMPRESSION: 1. Multifocal airspace opacities in the right lung concerning for
pneumonia.

## 2015-10-13 IMAGING — CR DG CHEST 2V
2 series · 2 of 2 positions shown · non-contrast
Comparison: 05/31/2014

CLINICAL DATA: Followup pneumonia.

EXAM:
CHEST  2 VIEW

[PA]
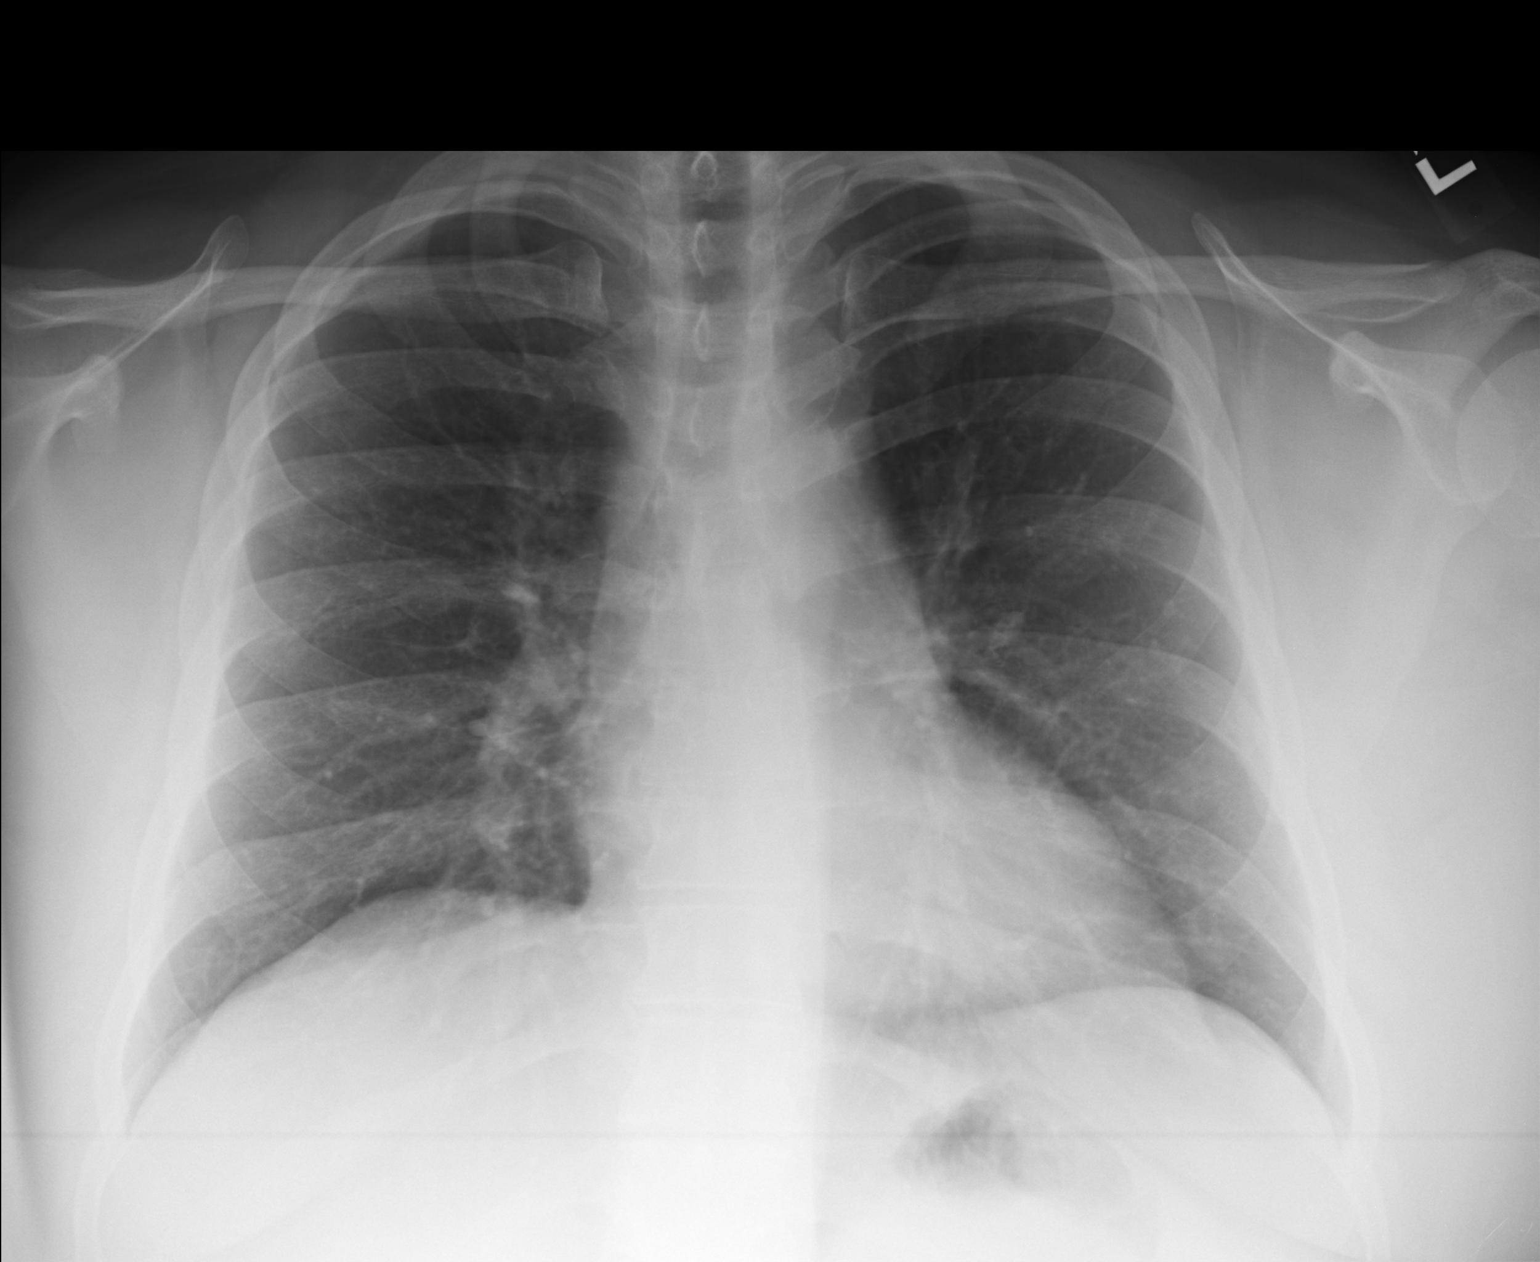

[lateral]
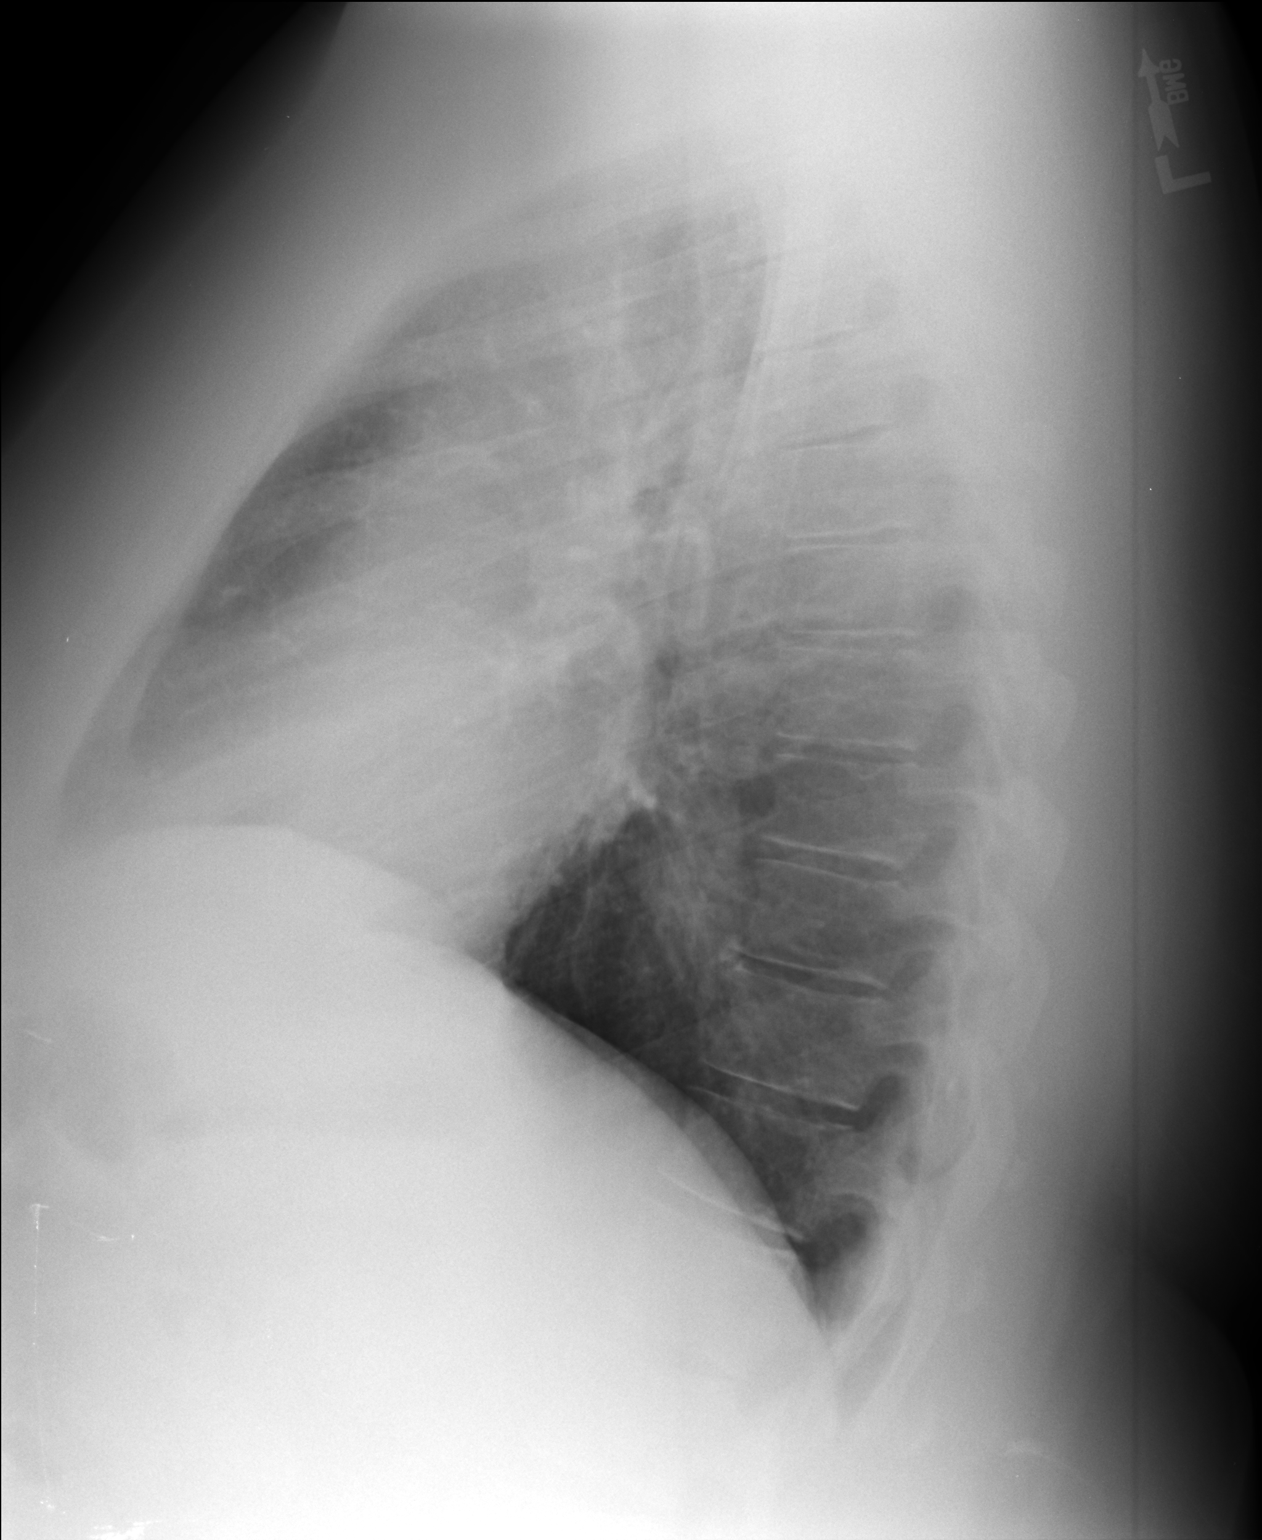

[2 of 2 positions shown; findings below may reference images not displayed]

FINDINGS: Lungs are adequately inflated with complete interval resolution of
the previously noted right-sided pneumonia. Cardiomediastinal
silhouette and remainder the exam is unchanged.
IMPRESSION: No active cardiopulmonary disease.
